# Patient Record
Sex: Female | Born: 1960 | Race: White | Hispanic: No | Marital: Married | Smoking: Never smoker
Health system: Southern US, Community
[De-identification: ages and names within clinical notes are randomized; demographics above are authoritative.]

## PROBLEM LIST (undated history)

## (undated) DIAGNOSIS — F32A Depression, unspecified: Secondary | ICD-10-CM

## (undated) DIAGNOSIS — F419 Anxiety disorder, unspecified: Secondary | ICD-10-CM

## (undated) DIAGNOSIS — F329 Major depressive disorder, single episode, unspecified: Secondary | ICD-10-CM

## (undated) HISTORY — DX: Anxiety disorder, unspecified: F41.9

## (undated) HISTORY — DX: Major depressive disorder, single episode, unspecified: F32.9

## (undated) HISTORY — DX: Depression, unspecified: F32.A

---

## 2012-09-27 ENCOUNTER — Ambulatory Visit (HOSPITAL_COMMUNITY): Payer: Self-pay | Admitting: Behavioral Health

## 2012-10-22 ENCOUNTER — Ambulatory Visit (HOSPITAL_COMMUNITY): Payer: Self-pay | Admitting: Behavioral Health

## 2012-11-14 ENCOUNTER — Ambulatory Visit (INDEPENDENT_AMBULATORY_CARE_PROVIDER_SITE_OTHER): Payer: 59 | Admitting: Behavioral Health

## 2012-11-14 ENCOUNTER — Encounter (HOSPITAL_COMMUNITY): Payer: Self-pay | Admitting: Behavioral Health

## 2012-11-14 DIAGNOSIS — F331 Major depressive disorder, recurrent, moderate: Secondary | ICD-10-CM

## 2012-11-14 DIAGNOSIS — F411 Generalized anxiety disorder: Secondary | ICD-10-CM | POA: Insufficient documentation
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
## 2012-12-16 ENCOUNTER — Ambulatory Visit (INDEPENDENT_AMBULATORY_CARE_PROVIDER_SITE_OTHER): Payer: 59 | Admitting: Behavioral Health

## 2012-12-16 DIAGNOSIS — F331 Major depressive disorder, recurrent, moderate: Secondary | ICD-10-CM

## 2012-12-17 ENCOUNTER — Encounter (HOSPITAL_COMMUNITY): Payer: Self-pay | Admitting: Behavioral Health

## 2012-12-17 NOTE — Progress Notes (Signed)
   THERAPIST PROGRESS NOTE  Session Time: 11:00  Participation Level: Active  Behavioral Response: CasualAlertAnxious  Type of Therapy: Individual Therapy  Treatment Goals addressed: Coping  Interventions: CBT  Summary: Hasna Stefanik is a 53 y.o. female who presents with anxiety and depression.   Suicidal/Homicidal: Nowithout intent/plan  Therapist Response: The client reported that she had a nice weekend away with friends that she had since childhood. She reported that she does not get to do that often and had a great time spending time with him. She reported that to the best of her knowledge her husband has been sober for about 2 months now. She indicated that she is appreciative of the fact that he is trying to stay sober and hopes that will improve the family atmosphere. We talked at length about her relationship with her husband as well as the children's relationship with her father. We talked about ways to improve family dynamics and coping skills for the client in terms of feeling like she is the" a peacemaker" in the family. She indicated willingness to do that but also knows that she needs to take care of herself so we spent time talking about coping skills for dealing with her stress and anxiety. She is committed to her family and open and willing to work on things that will be beneficial to improving family relationships. She does contract for safety saying she has no thoughts of hurting herself or anyone else.  Plan: Return again in 3 weeks.  Diagnosis: Axis I: 296.32    Axis II: Deferred    Rochelle Nephew M, LPC 12/17/2012

## 2012-12-30 ENCOUNTER — Ambulatory Visit (HOSPITAL_COMMUNITY): Payer: 59 | Admitting: Behavioral Health

## 2013-01-17 ENCOUNTER — Ambulatory Visit (INDEPENDENT_AMBULATORY_CARE_PROVIDER_SITE_OTHER): Payer: 59 | Admitting: Behavioral Health

## 2013-01-17 ENCOUNTER — Encounter (HOSPITAL_COMMUNITY): Payer: Self-pay | Admitting: Behavioral Health

## 2013-01-17 DIAGNOSIS — F331 Major depressive disorder, recurrent, moderate: Secondary | ICD-10-CM
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
## 2013-02-27 ENCOUNTER — Encounter (HOSPITAL_COMMUNITY): Payer: Self-pay | Admitting: Behavioral Health

## 2013-02-27 ENCOUNTER — Ambulatory Visit (INDEPENDENT_AMBULATORY_CARE_PROVIDER_SITE_OTHER): Payer: 59 | Admitting: Behavioral Health

## 2013-02-27 DIAGNOSIS — F331 Major depressive disorder, recurrent, moderate: Secondary | ICD-10-CM

## 2013-02-27 NOTE — Progress Notes (Signed)
   THERAPIST PROGRESS NOTE  Session Time: 10:00  Participation Level: Active  Behavioral Response: NeatAlertDepressed  Type of Therapy: Individual Therapy  Treatment Goals addressed: Coping  Interventions: CBT  Summary: Demonica Farrey is a 52 y.o. female who presents with depression.   Suicidal/Homicidal: Nowithout intent/plan  Therapist Response: The client presented with bright affect. She indicated that her family had been to the beach for one week. She indicated that for the most part things went well until halfway through the week where a family conversation led to conflict. We used that conflict to talk about family dynamics, progress being made with the clients husband in terms of reduction in alcohol consumption but also strong effort on his part to improve communication and relationship with his children and the client. The client indicated that although the incidence was very difficult for everyone emotionally she felt that relationships were strengthened. She also indicated that she feels more hope and more at peace for her family. She indicates that she is doing more to take care of herself and finding a better balance between her husband and her children. I did give her a relaxation CD which she agreed to try. She does contract for safety saying she has no thoughts of hurting herself or anyone else.   She did ask about a possible referral for one of her daughters. I will call a female therapist with [facility] to see if she is taking new clients and call the client back.  Plan: Return again in 4 weeks.  Diagnosis: Axis I: 296.32    Axis II: Deferred    French Ana, [facility] 02/27/2013

## 2013-03-20 ENCOUNTER — Ambulatory Visit (HOSPITAL_COMMUNITY): Payer: 59 | Admitting: Behavioral Health

## 2013-04-08 ENCOUNTER — Encounter (HOSPITAL_COMMUNITY): Payer: Self-pay | Admitting: Behavioral Health

## 2013-04-17 ENCOUNTER — Ambulatory Visit (INDEPENDENT_AMBULATORY_CARE_PROVIDER_SITE_OTHER): Payer: 59 | Admitting: Behavioral Health

## 2013-04-17 ENCOUNTER — Encounter (HOSPITAL_COMMUNITY): Payer: Self-pay | Admitting: Behavioral Health

## 2013-04-17 DIAGNOSIS — F321 Major depressive disorder, single episode, moderate: Secondary | ICD-10-CM
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
## 2013-04-30 ENCOUNTER — Encounter (HOSPITAL_COMMUNITY): Payer: Self-pay | Admitting: Behavioral Health

## 2013-04-30 ENCOUNTER — Ambulatory Visit (INDEPENDENT_AMBULATORY_CARE_PROVIDER_SITE_OTHER): Payer: 59 | Admitting: Behavioral Health

## 2013-04-30 DIAGNOSIS — F331 Major depressive disorder, recurrent, moderate: Secondary | ICD-10-CM

## 2013-04-30 DIAGNOSIS — F411 Generalized anxiety disorder: Secondary | ICD-10-CM
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
## 2013-05-20 ENCOUNTER — Encounter (HOSPITAL_COMMUNITY): Payer: Self-pay | Admitting: Behavioral Health

## 2013-05-20 ENCOUNTER — Ambulatory Visit (INDEPENDENT_AMBULATORY_CARE_PROVIDER_SITE_OTHER): Payer: 59 | Admitting: Behavioral Health

## 2013-05-20 DIAGNOSIS — F331 Major depressive disorder, recurrent, moderate: Secondary | ICD-10-CM

## 2013-05-20 NOTE — Progress Notes (Signed)
   THERAPIST PROGRESS NOTE  Session Time: 10:00  Participation Level: Active  Behavioral Response: NeatAlertDepressed  Type of Therapy: Individual Therapy  Treatment Goals addressed: Coping  Interventions: CBT  Summary: Patricia Gardner is a 52 y.o. female who presents with depression.   Suicidal/Homicidal: Nowithout intent/plan  Therapist Response:  The client presented with some family dynamic issues. She said that there have been some positive strides which she was appreciative of within the family and that she had a knowledge those. We used cognitive behavioral therapy techniques to come up with different ways to handle family dynamic situations based on the situation she presented. The client is doing a much better job of setting limits in terms of not getting in the middle of family conflict but is always looking for healthier ways to say things to both her husband and her children in terms of building family unity while at the same time taking care of herself. The client does contract for safety having no thoughts of hurting herself or anyone else.  Plan: Return again in 4 weeks.  Diagnosis: Axis I: 296.32    Axis II: Deferred    Verma Grothaus M, LPC 05/20/2013

## 2013-06-03 ENCOUNTER — Encounter (HOSPITAL_COMMUNITY): Payer: Self-pay | Admitting: Behavioral Health

## 2013-06-03 ENCOUNTER — Ambulatory Visit (INDEPENDENT_AMBULATORY_CARE_PROVIDER_SITE_OTHER): Payer: 59 | Admitting: Behavioral Health

## 2013-06-03 DIAGNOSIS — F331 Major depressive disorder, recurrent, moderate: Secondary | ICD-10-CM

## 2013-06-03 NOTE — Progress Notes (Signed)
   THERAPIST PROGRESS NOTE  Session Time: 10:00  Participation Level: Active  Behavioral Response: CasualAlertDepressed  Type of Therapy: Individual Therapy  Treatment Goals addressed: Coping  Interventions: CBT  Summary: Neka Bise is a 52 y.o. female who presents with depression.   Suicidal/Homicidal: Nowithout intent/plan  Therapist Response: The client reported that her family situation and did fairly calm since our last session. She reported that she feels her husband is making a good effort to reduce alcohol consumption and in the way that he relates to their children. She indicated that she recognizes that she needs to be more intentional of encouraging him and praising him when he does well in those areas. We talked about family dynamics and ingrained patterns of behavior as well as how to begin the process of changing expected behaviors especially if they are unhealthy. The client does contract for safety having no thoughts of hurting herself or anyone else.  Plan: Return again in 3 weeks.  Diagnosis: Axis I: 296.32    Axis II: Deferred    French Ana, [facility] 06/03/2013

## 2013-06-24 ENCOUNTER — Encounter (HOSPITAL_COMMUNITY): Payer: Self-pay | Admitting: Behavioral Health

## 2013-06-24 ENCOUNTER — Ambulatory Visit (INDEPENDENT_AMBULATORY_CARE_PROVIDER_SITE_OTHER): Payer: 59 | Admitting: Behavioral Health

## 2013-06-24 DIAGNOSIS — F331 Major depressive disorder, recurrent, moderate: Secondary | ICD-10-CM

## 2013-06-24 NOTE — Progress Notes (Signed)
   THERAPIST PROGRESS NOTE  Session Time: 10:00  Participation Level: Active  Behavioral Response: NeatAlertDepressed  Type of Therapy: Individual Therapy  Treatment Goals addressed: Coping  Interventions: CBT  Summary: Patricia Gardner is a 52 y.o. female who presents with depression.   Suicidal/Homicidal: Nowithout intent/plan  Therapist Response: The client indicated that she feels tired most of the time. We did discuss her going to see her doctor for an annual physical see if there was anything medically the needed to be addressed. We did run through it check list of symptoms for depression and she feels that the depression may have a small part in her fatigue but is not a major factor at this time. She feels in part that maybe being at home during the day by herself is contributing to desire to want to take a nap daily. We talked about ways that she could find some purpose in life as well as to give her an opportunity to stay busy and get out of the house other than doing things for her children. We looked at volunteer opportunities which may be a possibility.  We did briefly discuss some family issues including starting to take her daughter to visit colleges  The client indicates that she is happy for her daughter but knows that it will be a time of transition for the family. She indicated that otherwise things have been fairly calm around the house and she was thankful for that. She does contract for safety saying she has no thoughts of hurting herself or anyone else. Plan: Return again in 3 weeks.  Diagnosis: Axis I: 296.32    Axis II: Deferred    French Ana, [facility] 06/24/2013

## 2013-07-15 ENCOUNTER — Ambulatory Visit (INDEPENDENT_AMBULATORY_CARE_PROVIDER_SITE_OTHER): Payer: 59 | Admitting: Behavioral Health

## 2013-07-15 ENCOUNTER — Encounter (HOSPITAL_COMMUNITY): Payer: Self-pay | Admitting: Behavioral Health

## 2013-07-15 DIAGNOSIS — F331 Major depressive disorder, recurrent, moderate: Secondary | ICD-10-CM

## 2013-07-15 NOTE — Progress Notes (Signed)
   THERAPIST PROGRESS NOTE  Session Time: 11:00  Participation Level: Active  Behavioral Response: CasualAlertDepressed  Type of Therapy: Individual Therapy  Treatment Goals addressed: Coping  Interventions: CBT  Summary: Patricia Gardner is a 52 y.o. female who presents with depression.   Suicidal/Homicidal: Nowithout intent/plan  Therapist Response: The client reported that her oldest daughter recently told her that she is gay. The client indicated that she was caught off guard by the [employer]. Her daughter evidently misinterpreted things at the mother said thinking that the mother knew. She indicated that no matter what she loves her daughter but is wrestling with how she feels spiritually about this and how to best understanding and support her daughter. She indicates that her husband was initially calm about it but has since gotten angry and unloaded on the client.  This was a session which the client just needed to process everything that had taken place of the last week and a half and 2 flesh out how she feels and how she can best support her daughter both individually and how she can be supported by her family.  The client does contract for safety having thoughts of hurting himself or anyone else.  Plan: Return again in 3 weeks.  Diagnosis: Axis I: 296.32    Axis II: Deferred    French Ana, [facility] 07/15/2013

## 2013-07-29 ENCOUNTER — Encounter (HOSPITAL_COMMUNITY): Payer: Self-pay | Admitting: Behavioral Health

## 2013-07-29 ENCOUNTER — Ambulatory Visit (INDEPENDENT_AMBULATORY_CARE_PROVIDER_SITE_OTHER): Payer: 59 | Admitting: Behavioral Health

## 2013-07-29 DIAGNOSIS — F331 Major depressive disorder, recurrent, moderate: Secondary | ICD-10-CM

## 2013-07-29 NOTE — Progress Notes (Signed)
   THERAPIST PROGRESS NOTE  Session Time: 9:00  Participation Level: Active  Behavioral Response: NeatAlertDepressed  Type of Therapy: Individual Therapy  Treatment Goals addressed: Coping  Interventions: CBT  Summary: Lillyanne Bradburn is a 52 y.o. female who presents with depression.   Suicidal/Homicidal: Nowithout intent/plan  Therapist Response: The client continues to process family dynamics in relation to her daughter's tics duration of homosexuality to the family. The client continues to from her daughter that she loves accept her but admits that her. husband is struggling with how to understand and deal with his daughters announcement. We talked about family dynamics centered around that issue as well as past events involving conflict with the clients daughters and her husband. We stress how the client can best deal with family dynamics when she feels that she many times is" caught in the middle."  She does contract for safety having no thoughts of hurting herself or anyone else. She is attempting to engage in ways to take care of herself including a Bible study and become more active her children schools and in the community.  Plan: Return again in 4 weeks.  Diagnosis: Axis I: 296.32    Axis II: Deferred    French Ana, [facility] 07/29/2013

## 2013-08-19 ENCOUNTER — Encounter (HOSPITAL_COMMUNITY): Payer: Self-pay | Admitting: Behavioral Health

## 2013-08-19 ENCOUNTER — Ambulatory Visit (INDEPENDENT_AMBULATORY_CARE_PROVIDER_SITE_OTHER): Payer: 59 | Admitting: Behavioral Health

## 2013-08-19 ENCOUNTER — Encounter (INDEPENDENT_AMBULATORY_CARE_PROVIDER_SITE_OTHER): Payer: Self-pay

## 2013-08-19 DIAGNOSIS — F331 Major depressive disorder, recurrent, moderate: Secondary | ICD-10-CM

## 2013-08-19 NOTE — Progress Notes (Signed)
   THERAPIST PROGRESS NOTE  Session Time: 11:00  Participation Level: Active  Behavioral Response: NeatAlertDepressed/anxious  Type of Therapy: Individual Therapy  Treatment Goals addressed: Coping  Interventions: CBT  Summary: Patricia Gardner is a 53 y.o. female who presents with depression and anxiety.   Suicidal/Homicidal: Nowithout intent/plan  Therapist Response: The client reported that it had been relatively a calm holiday. She indicated that for the most part everyone had gotten along well. She indicates that when her husband is doing well in the relationship goes well and there appears to be more calm in the home. She still indicates a strained relationship between her oldest daughter and her husband. She indicates that in many ways it she sees some of her daughter's behaviors as having been modeled by her husbands and that concerns her. We spent time talking about their shared characteristics and how parental modeling can affect a teenager's behavior as they are still maturing. The client thinks that can be beneficial that the oldest daughter will be leaving for college next year and is praying for improved relationship between her oldest daughter and her husband.  The client continues to struggle with decisions regarding her family. She indicates that she knows how strongly her daughter still about her husband. She also sees her husband attempting to make efforts at reconciliation to continuing to make the same mistakes so there has been no improvement in the relationship between her husband and her daughters. We talked about the role that the client place and not and best ways for her to deal with that to keep herself healthy. I did recommend that the client suggested her husband seek therapy. I did recommend to female therapist and asked the client to call me if she had any questions. She does contract for safety. She reports that she is medication compliant.  Plan: Return again in 2  weeks.  Diagnosis: Axis I: 296.32    Axis II: Deferred    Gustava Berland M, LPC 08/19/2013

## 2013-09-02 ENCOUNTER — Encounter (HOSPITAL_COMMUNITY): Payer: Self-pay | Admitting: Behavioral Health

## 2013-09-02 ENCOUNTER — Ambulatory Visit (INDEPENDENT_AMBULATORY_CARE_PROVIDER_SITE_OTHER): Payer: 59 | Admitting: Behavioral Health

## 2013-09-02 DIAGNOSIS — F331 Major depressive disorder, recurrent, moderate: Secondary | ICD-10-CM

## 2013-09-02 NOTE — Progress Notes (Signed)
   THERAPIST PROGRESS NOTE  Session Time: 11:00  Participation Level: Active  Behavioral Response: NeatAlertDepressed  Type of Therapy: Individual Therapy  Treatment Goals addressed: Coping  Interventions: CBT  Summary: Patricia Gardner is a 53 y.o. female who presents with depression.   Suicidal/Homicidal: Nowithout intent/plan  Therapist Response: The patient at spent the past weekend and her mother's house in [place].. That was Christmas with her family as they spent actual Christmas holiday at her husband's family in [place]. She indicated that her husband and her brother-in-law has never gotten along to the point it became extremely verbal last Christmas. She is oriented sedating what the 2 of them could do next Christmas at her mothers house. We talked about setting limits in terms of how the client couldn't explain to her husband importance of spending time with her mother and sisters. She does report husband has a great relationship with everyone except his brother-in-law but cannot control his anger around brother-in-law. We also talked about focusing on the husband's drinks a making a more of a strength based approach and how she addresses him starting with conversations now so that it does not build up waiting for the holidays. The client does contract for safety having thoughts of hurting herself or anyone else.  Plan: Return again in 4 weeks.  Diagnosis: Axis I: 296.32    Axis II: Deferred    French AnaETERS,Patricia Gardner M, LPC 09/02/2013

## 2013-09-16 ENCOUNTER — Ambulatory Visit (INDEPENDENT_AMBULATORY_CARE_PROVIDER_SITE_OTHER): Payer: 59 | Admitting: Behavioral Health

## 2013-09-16 ENCOUNTER — Encounter (HOSPITAL_COMMUNITY): Payer: Self-pay | Admitting: Behavioral Health

## 2013-09-16 DIAGNOSIS — F331 Major depressive disorder, recurrent, moderate: Secondary | ICD-10-CM

## 2013-09-16 NOTE — Progress Notes (Signed)
   THERAPIST PROGRESS NOTE  Session Time: 11:00  Participation Level: Active  Behavioral Response: CasualAlertDepressed  Type of Therapy: Individual Therapy  Treatment Goals addressed: Coping  Interventions: CBT  Summary: Patricia Gardner is a 53 y.o. female who presents with depression and anxiety.   Suicidal/Homicidal: Nowithout intent/plan  Therapist Response: The client presents with family issues sharing an incident in which there is discord between her children and her husband. In this case she indicated that both sides could of handled the situation differently and that her but she feels that she is continually caught in the middle. We talked about how that can lead to triangulation within the family which is not healthy. We talked at length about setting healthy boundaries with both which involves reassuring each other they are not taking sides but there are family rules to follow the client does report this is creating stress and anxiety for her but recognizes that there is only so much of the discord that she can change if other parts are not willing to make changes.  He also talked about the importance for the client attending and down 20 to church and how that verbal effect on the family. Her middle daughter is going to church with her and she feels her younger son will go with her. She indicates that she is not attempting to find the family but had concerns that she told her husband that she was going to make the step of joining a church that he would react negatively. We talked about she could reframe that in a positive way so as to take the focus off of her husband and onto her doing what is best for herself and for the family. She does contract for safety having no thoughts of hurting herself or anyone else.  Plan: Return again in 2 weeks.  Diagnosis: Axis I: 296.32    Axis II: Deferred    Odean Fester M, LPC 09/16/2013

## 2013-09-18 ENCOUNTER — Ambulatory Visit (HOSPITAL_COMMUNITY): Payer: 59 | Admitting: Behavioral Health

## 2013-09-30 ENCOUNTER — Ambulatory Visit (HOSPITAL_COMMUNITY): Payer: 59 | Admitting: Behavioral Health

## 2013-10-03 ENCOUNTER — Ambulatory Visit (INDEPENDENT_AMBULATORY_CARE_PROVIDER_SITE_OTHER): Payer: 59 | Admitting: Behavioral Health

## 2013-10-03 ENCOUNTER — Encounter (HOSPITAL_COMMUNITY): Payer: Self-pay | Admitting: Behavioral Health

## 2013-10-03 DIAGNOSIS — F331 Major depressive disorder, recurrent, moderate: Secondary | ICD-10-CM

## 2013-10-03 NOTE — Progress Notes (Signed)
   THERAPIST PROGRESS NOTE  Session Time: 11:00  Participation Level: Active  Behavioral Response: NeatAlertDepressed/anxious  Type of Therapy: Individual Therapy  Treatment Goals addressed: Coping  Interventions: CBT  Summary: Patricia Gardner is a 53 y.o. female who presents with depression and anxiety.   Suicidal/Homicidal: Nowithout intent/plan  Therapist Response: The client reports that the past week and a difficult. She indicated that beginning with the Super Bowl night to her husband had consumed too much alcohol for three out of four consecutive nights. She indicated that her daughter said to her therapist that one of her trigger for being suicidal with her father's alcohol use. The client indicated that she confronted her husband saying that she was concerned that he was drinking too much again and that he was drinking on work nights. She said that he initially denied but later confessed that. She told us about the daughter being suicidal when he was drinking and his response was something to the effect of" what does it matter any way? "By that she feels that he meant that any effort he had made when sober was not helping to reconnect with her daughter so he might as well drink alcohol. She indicated that she told him that if he drank again that she would ask him to leave the home. She is concerned that he will do well for a while and he relapsed again which is his pattern without getting any help. She gave him several names of counselors and asked him to go to AA. I also recommended that she go to Al-Anon starting as soon as possible. We also talked about the possibility of her sitting down with her teenage daughters telling them that she had set a boundary with her father in terms of alcohol use. She is experiencing some guilt and that she hasn't done something sooner. We helped to work through the guilt knowing that she in the mom was always felt as if she was doing something that was  best for the marriage and family. She recognizes now that it is to a point where things have to change significantly. She is starting to see changes in her children which are not healthy. The client does contract for safety having no thoughts of hurting herself or anyone else.  Plan:Return again in 1 weeks.  Diagnosis: Axis I: 296.32    Axis II: Deferred    French AnaETERS,Daelyn Pettaway M, LPC 10/03/2013

## 2013-10-08 ENCOUNTER — Ambulatory Visit (HOSPITAL_COMMUNITY): Payer: 59 | Admitting: Behavioral Health

## 2013-10-09 ENCOUNTER — Ambulatory Visit (HOSPITAL_COMMUNITY): Payer: 59 | Admitting: Behavioral Health

## 2013-10-15 ENCOUNTER — Ambulatory Visit (INDEPENDENT_AMBULATORY_CARE_PROVIDER_SITE_OTHER): Payer: 59 | Admitting: Behavioral Health

## 2013-10-15 ENCOUNTER — Encounter (INDEPENDENT_AMBULATORY_CARE_PROVIDER_SITE_OTHER): Payer: Self-pay

## 2013-10-15 ENCOUNTER — Encounter (HOSPITAL_COMMUNITY): Payer: Self-pay | Admitting: Behavioral Health

## 2013-10-15 DIAGNOSIS — F3289 Other specified depressive episodes: Secondary | ICD-10-CM

## 2013-10-15 DIAGNOSIS — F32A Depression, unspecified: Secondary | ICD-10-CM

## 2013-10-15 DIAGNOSIS — F329 Major depressive disorder, single episode, unspecified: Secondary | ICD-10-CM

## 2013-10-15 NOTE — Progress Notes (Signed)
   THERAPIST PROGRESS NOTE  Session Time: 10:00  Participation Level: Active  Behavioral Response: CasualAlertDepressed  Type of Therapy: Individual Therapy  Treatment Goals addressed: Coping  Interventions: CBT  Summary: Patricia Gardner is a 53 y.o. female who presents with depression and anxiety.   Suicidal/Homicidal: Nowithout intent/plan  Therapist Response: The client continues to present with family difficulties which contribute to her anxiety and depression. She indicated that there was an incident this week in which there was some conflict between her son and her husband. The client did say that her son was somewhat disrespectful but her husband handled the situation very poorly and said things that should not have been said. The client indicated that her husband later apologized and was remorseful for his actions but when he did was both verbally and emotionally abusive toward their son and toward her.   She indicates this has been a pattern for the last 18 years and she has become numb to it to an extent. She did indicate that she felt good setting a boundary with the alcohol and we talked about how she could set clear boundaries again. She indicates that the husband has been in therapy before but I suggest that be something that she tell him he has to do because of the effect it is having on the client and the rest of the family. Concern is that he'll do well for a while and then relapsed and is the only behavior that he appears to know. She described as having no other well to draw from. We talked about action she can take in terms of helping him set up with a therapist. We talked about her not reacting to anger and arguing with him. We talked about her continuing to go to church and invite other family marriage to go with her and to pray for her husband.   she did indicate she was thinking about taking to an attorney about separation and was honest with her husband about that. It is  not step that she wants to take this is a something has to break the cycle of him getting angry and having far-reaching effects to all of the family. The client is doing things to take care of herself including a Bible study. She does contract for safety saying she has no thoughts of hurting herself or anyone else. She did say that she felt safe leaving her husband would not go beyond being verbally abusive. Plan: Return again in 2 weeks.  Diagnosis: Axis I: 296.32    Axis II: Deferred    Patricia Gardner M, [facility] 10/15/2013

## 2013-10-22 ENCOUNTER — Encounter (INDEPENDENT_AMBULATORY_CARE_PROVIDER_SITE_OTHER): Payer: Self-pay

## 2013-10-22 ENCOUNTER — Ambulatory Visit (INDEPENDENT_AMBULATORY_CARE_PROVIDER_SITE_OTHER): Payer: 59 | Admitting: Behavioral Health

## 2013-10-22 ENCOUNTER — Other Ambulatory Visit: Payer: Self-pay | Admitting: Physician Assistant

## 2013-10-22 ENCOUNTER — Encounter (HOSPITAL_COMMUNITY): Payer: Self-pay | Admitting: Behavioral Health

## 2013-10-22 DIAGNOSIS — Z1231 Encounter for screening mammogram for malignant neoplasm of breast: Secondary | ICD-10-CM

## 2013-10-22 DIAGNOSIS — F321 Major depressive disorder, single episode, moderate: Secondary | ICD-10-CM

## 2013-10-22 NOTE — Progress Notes (Signed)
   THERAPIST PROGRESS NOTE  Session Time: 10:00  Participation Level: Active  Behavioral Response: CasualAlertDepressed  Type of Therapy: Individual Therapy  Treatment Goals addressed: Coping  Interventions: CBT  Summary: Hennie DuosKelly Schonberg is a 53 y.o. female who presents with depression.   Suicidal/Homicidal: Nowithout intent/plan  Therapist Response: The client reported that he did been a relatively quiet week. She indicated that everyone in the family had been busy there has not been much time for conflict. She indicated that her husband has been working a Therapist, nutritionallot preparing a commercial for his company and will be leaving on Friday for a few days out of town for work.  Today she wanted to address issues and how to present and to her husband. She is ready to join a church that she and her daughter have been visiting him once to tell her husband that she will be joining. She also indicates that she is always felt tithing is important that she has not been consistently. 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 She feels like he will be accepting of both issues that she wants to talk about so we talked about the ways that she could say that when would be the best time to speak with him. We also talked about getting therapist referral names to the husband for him to use when he chooses to. Previously the patient said that she left up to him to come to her but felt now that would be better if she gave him the names of the 2 therapist I recommended to give him the chance to make that decision without having to come to her. We talked about making sure she did not assume the" mother" roll with her husband. She does contract for safety saying she has no thoughts of hurting herself or anyone else  Plan: Return again in 2 weeks.  Diagnosis: Axis I: 296.32    Axis II: Deferred    French AnaETERS,Hermon Zea M,  [facility] 10/22/2013

## 2013-10-28 ENCOUNTER — Ambulatory Visit (INDEPENDENT_AMBULATORY_CARE_PROVIDER_SITE_OTHER): Payer: 59

## 2013-10-28 DIAGNOSIS — Z1231 Encounter for screening mammogram for malignant neoplasm of breast: Secondary | ICD-10-CM

## 2014-10-20 ENCOUNTER — Other Ambulatory Visit: Payer: Self-pay | Admitting: Internal Medicine

## 2014-10-20 DIAGNOSIS — Z1231 Encounter for screening mammogram for malignant neoplasm of breast: Secondary | ICD-10-CM

## 2014-11-04 ENCOUNTER — Ambulatory Visit (INDEPENDENT_AMBULATORY_CARE_PROVIDER_SITE_OTHER): Payer: 59

## 2014-11-04 DIAGNOSIS — Z1231 Encounter for screening mammogram for malignant neoplasm of breast: Secondary | ICD-10-CM

## 2014-11-04 IMAGING — MG MM DIGITAL SCREENING BILAT W/ CAD
4 series · 4 of 4 positions shown · non-contrast
Comparison: Previous exam(s).

CLINICAL DATA: Screening.

EXAM:
DIGITAL SCREENING BILATERAL MAMMOGRAM WITH CAD

[R CC]
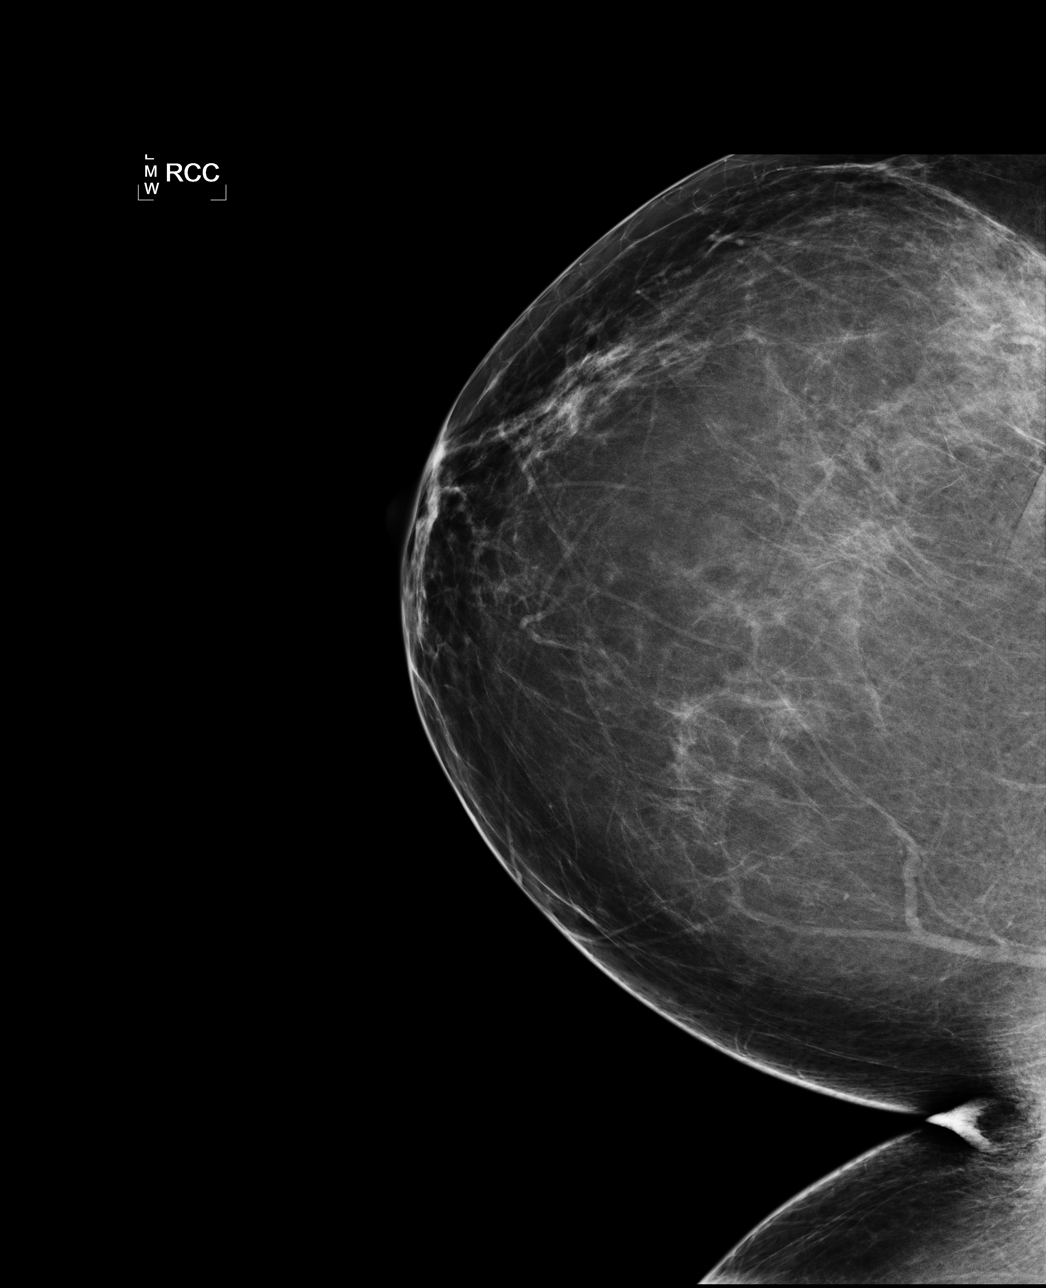

[L CC]
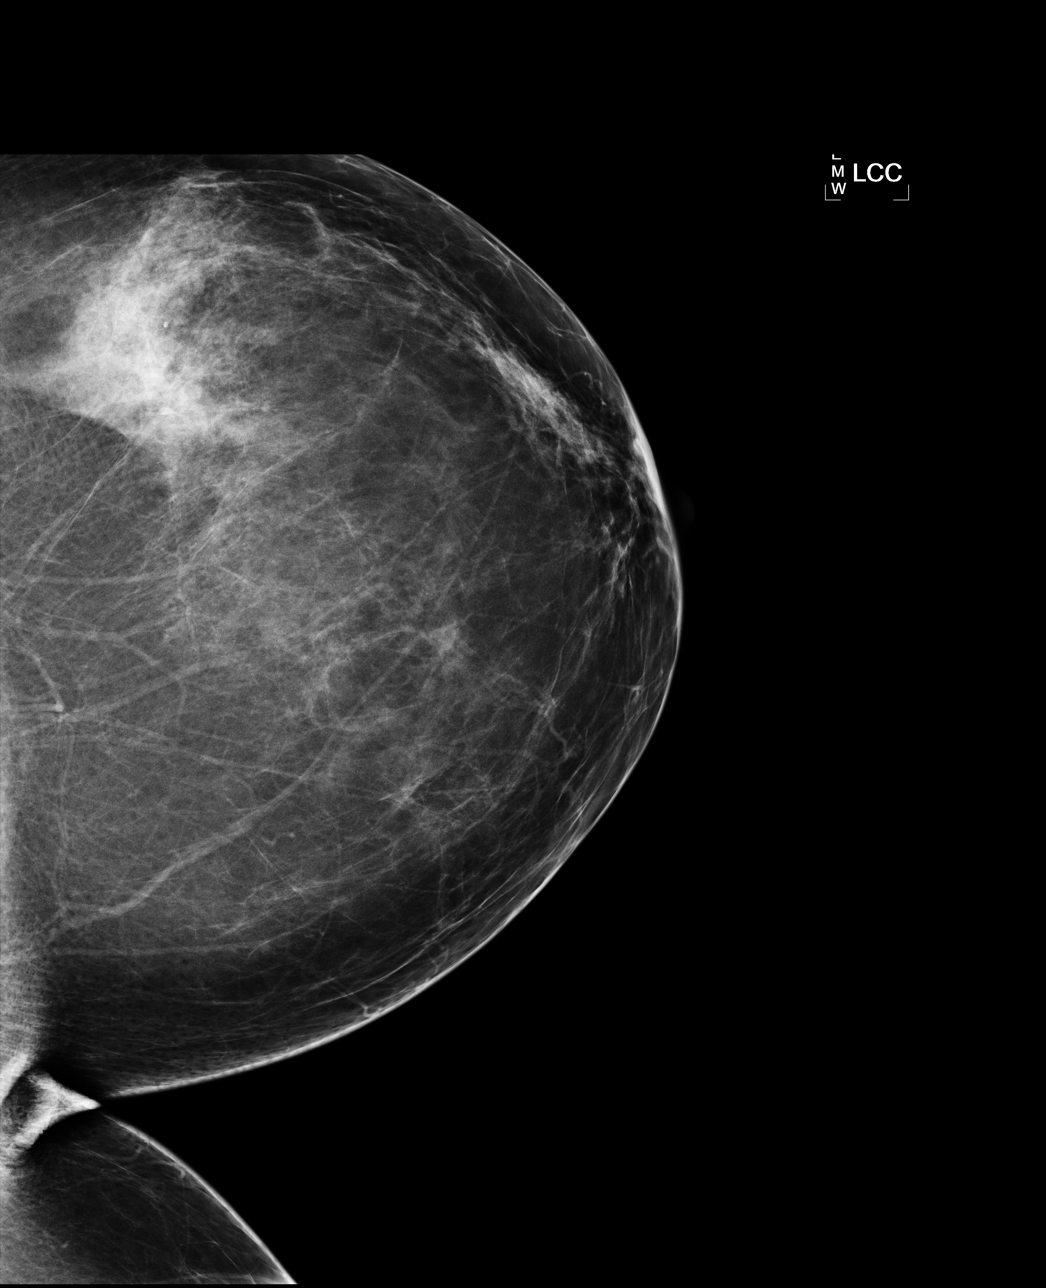

[L MLO]
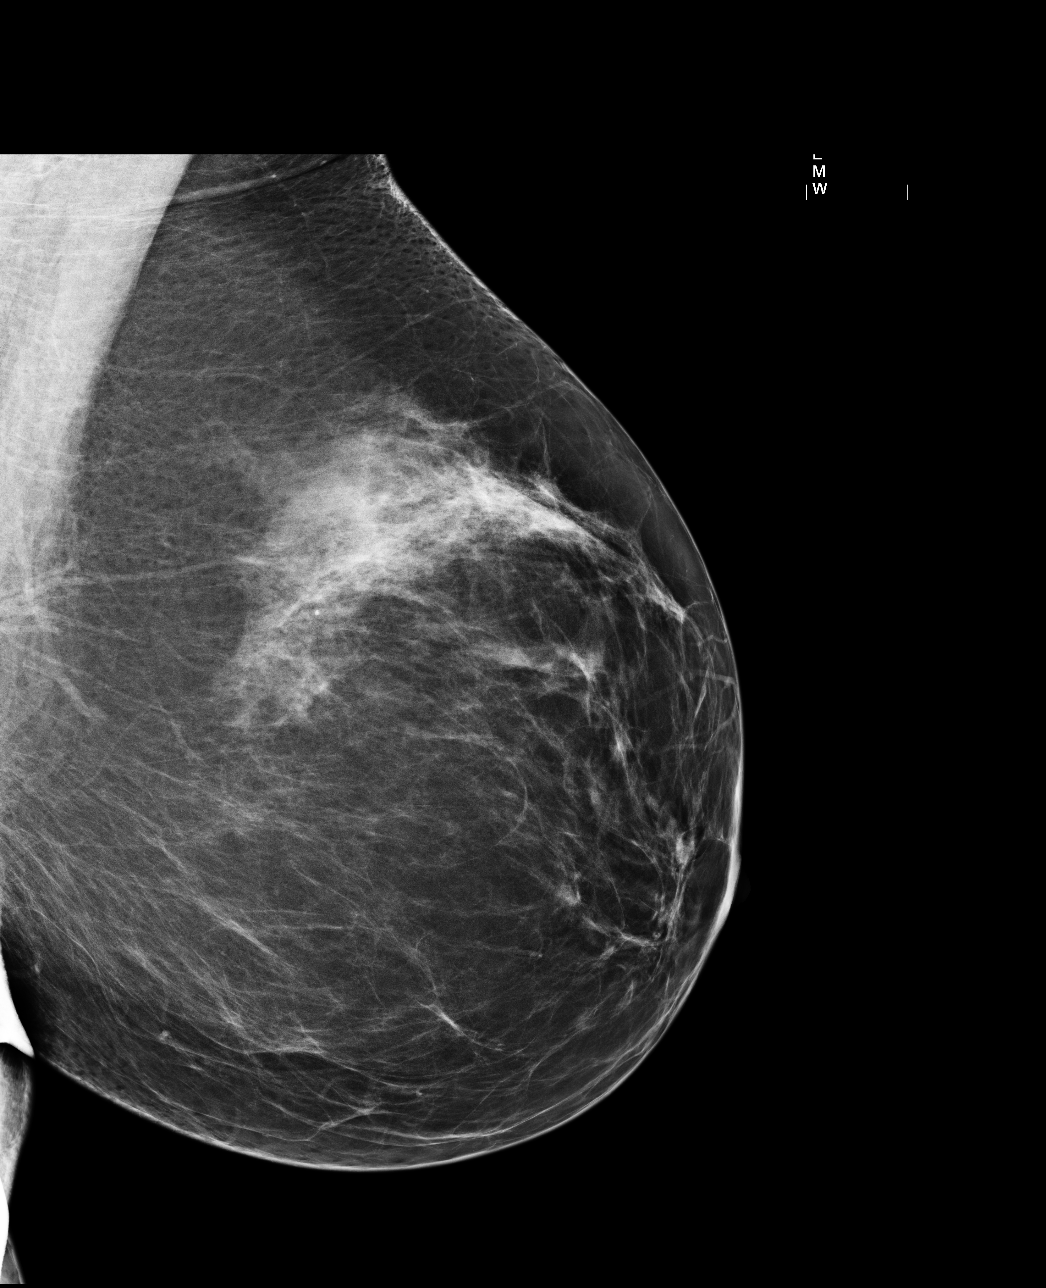

[R MLO]
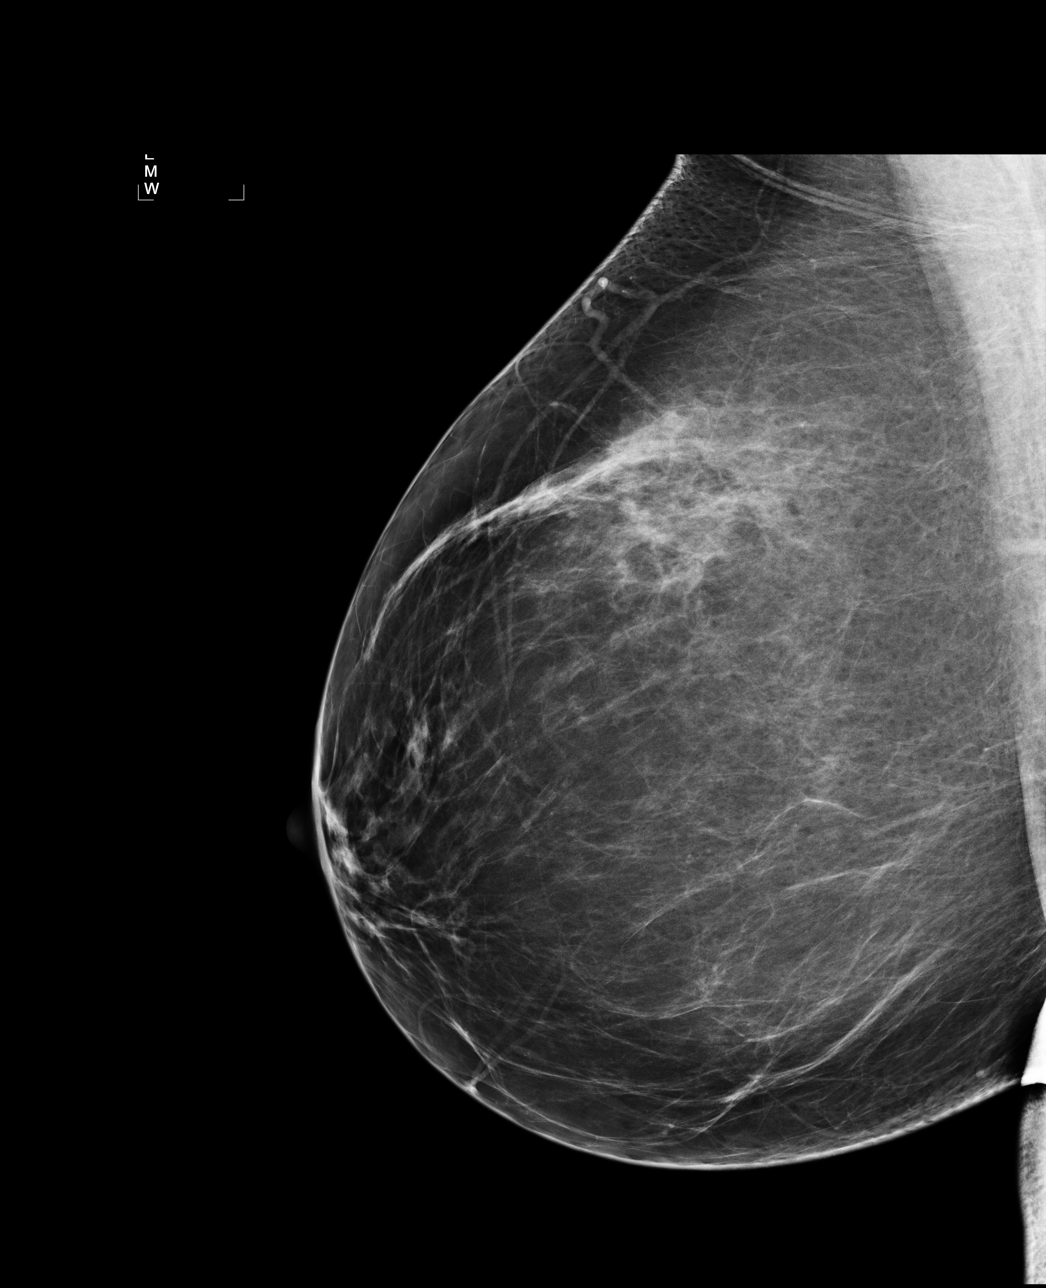

[4 of 4 positions shown; findings below may reference images not displayed]

ACR Breast Density Category b: There are scattered areas of
fibroglandular density.
FINDINGS: There are no findings suspicious for malignancy. Images were
processed with CAD.
IMPRESSION: No mammographic evidence of malignancy. A result letter of this
screening mammogram will be mailed directly to the patient.

RECOMMENDATION:
Screening mammogram in one year. (Code:[[place]])

BI-RADS CATEGORY  1: Negative.
